# Patient Record
Sex: Female | Born: 1950 | Race: White | Hispanic: No | Marital: Married
Health system: Southern US, Community
[De-identification: ages and names within clinical notes are randomized; demographics above are authoritative.]

---

## 2020-03-10 ENCOUNTER — Inpatient Hospital Stay
Admission: RE | Admit: 2020-03-10 | Discharge: 2020-03-31 | Disposition: A | Discharge status: Skilled Nursing Facility | Payer: Medicare Other | Source: Ambulatory Visit | Attending: Internal Medicine | Admitting: Internal Medicine

## 2020-03-10 ENCOUNTER — Other Ambulatory Visit (HOSPITAL_COMMUNITY): Payer: Medicare Other

## 2020-03-10 DIAGNOSIS — J9690 Respiratory failure, unspecified, unspecified whether with hypoxia or hypercapnia: Secondary | ICD-10-CM

## 2020-03-10 DIAGNOSIS — R188 Other ascites: Secondary | ICD-10-CM

## 2020-03-10 DIAGNOSIS — J90 Pleural effusion, not elsewhere classified: Secondary | ICD-10-CM

## 2020-03-10 DIAGNOSIS — R0602 Shortness of breath: Secondary | ICD-10-CM

## 2020-03-10 LAB — CBC
HCT: 30.8 % — ABNORMAL LOW (ref 36.0–46.0)
Hemoglobin: 9.2 g/dL — ABNORMAL LOW (ref 12.0–15.0)
MCH: 27.9 pg (ref 26.0–34.0)
MCHC: 29.9 g/dL — ABNORMAL LOW (ref 30.0–36.0)
MCV: 93.3 fL (ref 80.0–100.0)
Platelets: 216 10*3/uL (ref 150–400)
RBC: 3.3 MIL/uL — ABNORMAL LOW (ref 3.87–5.11)
RDW: 17 % — ABNORMAL HIGH (ref 11.5–15.5)
WBC: 7.7 10*3/uL (ref 4.0–10.5)
nRBC: 3.5 % — ABNORMAL HIGH (ref 0.0–0.2)

## 2020-03-10 LAB — PATHOLOGIST SMEAR REVIEW

## 2020-03-10 LAB — APTT: aPTT: 24 seconds (ref 24–36)

## 2020-03-10 LAB — COMPREHENSIVE METABOLIC PANEL
ALT: 14 U/L (ref 0–44)
AST: 16 U/L (ref 15–41)
Albumin: 3.5 g/dL (ref 3.5–5.0)
Alkaline Phosphatase: 74 U/L (ref 38–126)
Anion gap: 5 (ref 5–15)
BUN: 9 mg/dL (ref 8–23)
CO2: 30 mmol/L (ref 22–32)
Calcium: 8.3 mg/dL — ABNORMAL LOW (ref 8.9–10.3)
Chloride: 99 mmol/L (ref 98–111)
Creatinine, Ser: 2.39 mg/dL — ABNORMAL HIGH (ref 0.44–1.00)
GFR, Estimated: 22 mL/min — ABNORMAL LOW (ref >60)
Glucose, Bld: 88 mg/dL (ref 70–99)
Potassium: 3.7 mmol/L (ref 3.5–5.1)
Sodium: 134 mmol/L — ABNORMAL LOW (ref 135–145)
Total Bilirubin: 0.6 mg/dL (ref 0.3–1.2)
Total Protein: 5.4 g/dL — ABNORMAL LOW (ref 6.5–8.1)

## 2020-03-10 LAB — PROTIME-INR
INR: 1.1 (ref 0.8–1.2)
Prothrombin Time: 14 seconds (ref 11.4–15.2)

## 2020-03-11 LAB — RENAL FUNCTION PANEL
Albumin: 3.2 g/dL — ABNORMAL LOW (ref 3.5–5.0)
Anion gap: 10 (ref 5–15)
BUN: 20 mg/dL (ref 8–23)
CO2: 29 mmol/L (ref 22–32)
Calcium: 9.1 mg/dL (ref 8.9–10.3)
Chloride: 99 mmol/L (ref 98–111)
Creatinine, Ser: 3.58 mg/dL — ABNORMAL HIGH (ref 0.44–1.00)
GFR, Estimated: 13 mL/min — ABNORMAL LOW (ref >60)
Glucose, Bld: 96 mg/dL (ref 70–99)
Phosphorus: 4 mg/dL (ref 2.5–4.6)
Potassium: 3.9 mmol/L (ref 3.5–5.1)
Sodium: 138 mmol/L (ref 135–145)

## 2020-03-11 LAB — HEPATITIS B SURFACE ANTIGEN: Hepatitis B Surface Ag: NONREACTIVE

## 2020-03-11 LAB — CBC
HCT: 28.3 % — ABNORMAL LOW (ref 36.0–46.0)
Hemoglobin: 8.8 g/dL — ABNORMAL LOW (ref 12.0–15.0)
MCH: 28.9 pg (ref 26.0–34.0)
MCHC: 31.1 g/dL (ref 30.0–36.0)
MCV: 93.1 fL (ref 80.0–100.0)
Platelets: 239 10*3/uL (ref 150–400)
RBC: 3.04 MIL/uL — ABNORMAL LOW (ref 3.87–5.11)
RDW: 17.2 % — ABNORMAL HIGH (ref 11.5–15.5)
WBC: 6.5 10*3/uL (ref 4.0–10.5)
nRBC: 3.2 % — ABNORMAL HIGH (ref 0.0–0.2)

## 2020-03-11 LAB — HEPATITIS B CORE ANTIBODY, TOTAL: Hep B Core Total Ab: NONREACTIVE

## 2020-03-11 LAB — HEPATITIS B SURFACE ANTIBODY,QUALITATIVE: Hep B S Ab: NONREACTIVE

## 2020-03-11 NOTE — Consult Note (Signed)
[facility] CONSULT NOTE    Date: 03/11/2020                  Patient Name:  Jillian Paul  MRN: [id number]  DOB: 04-16-1950  Age / Sex: 69 y.o., female         PCP: No primary care provider on file.                 Service Requesting Consult:  Hospitalist                 Reason for Consult:  Acute kidney injury            History of Present Illness: Patient is a 69 y.o. 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  She presented to [facility] due to anemia from GI blood loss.  She was quite hypotensive upon admission there on 02/28/2020.  Due to hypotension patient developed acute kidney injury on chronic kidney disease stage IIIb baseline EGFR 41.  She recently received albumin, octreotide, midodrine for hepatorenal syndrome.  However then she was started on hemodialysis on 02/29/2020.  Patient also had acute on chronic respiratory failure.  Ended up having thoracentesis.  In addition patient ended up having multiple paracentesis for liver cirrhosis.  She has a right internal jugular PermCath now in place.   Medications:  Current medications: Humalog sliding scale insulin, aspirin 81 mg daily, Calcitrol 0.25 mcg daily, ferrous sulfate 325 mg twice daily, levothyroxine 75 mcg daily, midodrine 15 mg 3 times daily, montelukast 10 mg daily, amiodarone 400 mg twice daily, renal vitamin 1 tablet daily, pantoprazole 40 mg twice daily, vitamin B12 1000 mcg daily, vitamin C 500 mg daily, nystatin 5 cc every 6 hours   Allergies: Atorvastatin, codeine, ibuprofen,  tramadol   Past Medical History: paroxysmal atrial fibrillation, aortic valve stenosis, heart failure with preserved EF, history of Hodgkin's lymphoma, COPD, coronary disease status post CABG and TAVR, hypertension, hyperlipidemia, hypothyroidism, liver cirrhosis with ascites, chronic kidney disease stage IIIb baseline EGFR 41, chronic respiratory failure, anemia of chronic kidney disease   Past Surgical History: Coronary disease status post CABG and TAVR Permacath placement History of multiple thoracentesis and paracentesis Cholecystectomy Bilateral hand surgery for trigger finger Hysterectomy Left inferior parathyroidectomy   Family History: Mother with history of coronary artery disease as well as diabetes mellitus.  Patient's father had history of mesothelioma.  Social History: Patient is married.  Former tobacco user.  Quit cigarettes in 1972.  No current alcohol or illicits.  Review of Systems: As per HPI  Vital Signs: Temperature 96.8 pulse 86 respirations 27 blood pressure 95/64 Weight trends: There were no vitals filed for this visit.  Physical Exam: Physical Exam: General:  No acute distress  Head:  Normocephalic, atraumatic. Moist oral mucosal membranes  Eyes:  Anicteric  Neck:  Supple  Lungs:   Scattered rhonchi, normal effort  Heart:  S1S2 irregular  Abdomen:   Soft, nontender, distention noted  Extremities:  3+ peripheral edema.  Neurologic:  Awake, alert, following commands  Skin:  Bilateral upper and lower extremity ecchymoses  Access:  Right IJ PermCath    Lab results:  Basic Metabolic Panel: Recent Labs  Lab 03/10/20 0544  NA 134*  K 3.7  CL 99  CO2 30  GLUCOSE 88  BUN 9  CREATININE 2.39*  CALCIUM 8.3*    Liver Function Tests: Recent Labs  Lab 03/10/20 0544  AST 16  ALT 14  ALKPHOS 74  BILITOT 0.6  PROT 5.4*  ALBUMIN 3.5   No results for input(s): LIPASE, AMYLASE in the last 168 hours. No results for input(s): AMMONIA in the  last 168 hours.  CBC: Recent Labs  Lab 03/10/20 0544  WBC 7.7  HGB 9.2*  HCT 30.8*  MCV 93.3  PLT 216    Cardiac Enzymes: No results for input(s): CKTOTAL, CKMB, CKMBINDEX, TROPONINI in the last 168 hours.  BNP: Invalid input(s): POCBNP  CBG: No results for input(s): GLUCAP in the last 168 hours.  Microbiology: No results found for this or any previous visit.  Coagulation Studies: Recent Labs    03/10/20 0544  LABPROT 14.0  INR 1.1    Urinalysis: No results for input(s): COLORURINE, LABSPEC, PHURINE, GLUCOSEU, HGBUR, BILIRUBINUR, KETONESUR, PROTEINUR, UROBILINOGEN, NITRITE, LEUKOCYTESUR in the last 72 hours.  Invalid input(s): APPERANCEUR    Imaging: DG Chest 1 View  Result Date: 03/10/2020 CLINICAL DATA:  Respiratory failure EXAM: CHEST  1 VIEW COMPARISON:  03/08/2020 FINDINGS: Unchanged airspace disease with air bronchograms at the bases. Continued pleural effusions with loculation on the right by recent CT, which explains the asymmetric hazy right apical density. Normal heart size. Transcatheter aortic valve replacement. Right-sided central lines in similar position. IMPRESSION: Unchanged airspace disease and pleural effusions with loculated pleural fluid on the right. Electronically Signed   By: Monte Fantasia M.D.   On: 03/10/2020 06:40      Assessment & Plan: Pt is a 69 y.o. 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  1.  Acute kidney injury/chronic kidney disease stage IIIb baseline EGFR 41.  The patient recently developed acute kidney injury requiring dialysis.   Right IJ PermCath in place.  We will plan for hemodialysis treatment today.  Ultrafiltration target 2 to 2.5 kg with use of albumin to treat underlying volume overload as well.  2.  Generalized edema/volume overload.  We will plan for use of albumin for blood pressure support during dialysis treatment given underlying cirrhosis.  Ultrafiltration target 2 to 2.5 kg as above.  3.  Anemia chronic kidney disease.  Hemoglobin 9.2.  Start the patient on Retacrit 6000 units IV with each dialysis treatment.

## 2020-03-13 LAB — CBC
HCT: 30.3 % — ABNORMAL LOW (ref 36.0–46.0)
Hemoglobin: 9 g/dL — ABNORMAL LOW (ref 12.0–15.0)
MCH: 27.9 pg (ref 26.0–34.0)
MCHC: 29.7 g/dL — ABNORMAL LOW (ref 30.0–36.0)
MCV: 93.8 fL (ref 80.0–100.0)
Platelets: 256 10*3/uL (ref 150–400)
RBC: 3.23 MIL/uL — ABNORMAL LOW (ref 3.87–5.11)
RDW: 17.5 % — ABNORMAL HIGH (ref 11.5–15.5)
WBC: 7 10*3/uL (ref 4.0–10.5)
nRBC: 2.8 % — ABNORMAL HIGH (ref 0.0–0.2)

## 2020-03-13 LAB — RENAL FUNCTION PANEL
Albumin: 3.4 g/dL — ABNORMAL LOW (ref 3.5–5.0)
Anion gap: 13 (ref 5–15)
BUN: 16 mg/dL (ref 8–23)
CO2: 29 mmol/L (ref 22–32)
Calcium: 8.7 mg/dL — ABNORMAL LOW (ref 8.9–10.3)
Chloride: 98 mmol/L (ref 98–111)
Creatinine, Ser: 3.68 mg/dL — ABNORMAL HIGH (ref 0.44–1.00)
GFR, Estimated: 13 mL/min — ABNORMAL LOW (ref >60)
Glucose, Bld: 83 mg/dL (ref 70–99)
Phosphorus: 4 mg/dL (ref 2.5–4.6)
Potassium: 4.1 mmol/L (ref 3.5–5.1)
Sodium: 140 mmol/L (ref 135–145)

## 2020-03-13 NOTE — Progress Notes (Signed)
  [facility]  ROUNDING NOTE   Subjective:  Patient reports making very little urine at the moment. Currently sitting up in a chair. Due for dialysis treatment again today.   Objective:  Vital signs in last 24 hours:  Temperature 96 pulse 86 respirations 22 blood pressure 121/49   Physical Exam: General:  No acute distress  Head:  Normocephalic, atraumatic. Moist oral mucosal membranes  Eyes:  Anicteric  Neck:  Supple  Lungs:   Clear to auscultation, normal effort  Heart:  S1S2 no rubs  Abdomen:   Soft, nontender, bowel sounds present  Extremities:  2+ peripheral edema.  Neurologic:  Awake, alert, following commands  Skin:  No lesions  Access:  Right IJ PermCath    Basic Metabolic Panel: Recent Labs  Lab 03/10/20 0544 03/11/20 1449 03/13/20 0555  NA 134* 138 140  K 3.7 3.9 4.1  CL 99 99 98  CO2 _0 GLUCOSE 88 96 83  BUN _1 CREATININE 2.39* 3.58* 3.68*  CALCIUM 8.3* 9.1 8.7*  PHOS  --  4.0 4.0    Liver Function Tests: Recent Labs  Lab 03/10/20 0544 03/11/20 1449 03/13/20 0555  AST 16  --   --   ALT 14  --   --   ALKPHOS 74  --   --   BILITOT 0.6  --   --   PROT 5.4*  --   --   ALBUMIN 3.5 3.2* 3.4*   No results for input(s): LIPASE, AMYLASE in the last 168 hours. No results for input(s): AMMONIA in the last 168 hours.  CBC: Recent Labs  Lab 03/10/20 0544 03/11/20 1449 03/13/20 0555  WBC 7.7 6.5 7.0  HGB 9.2* 8.8* 9.0*  HCT 30.8* 28.3* 30.3*  MCV 93.3 93.1 93.8  PLT 216 239 256    Cardiac Enzymes: No results for input(s): CKTOTAL, CKMB, CKMBINDEX, TROPONINI in the last 168 hours.  BNP: Invalid input(s): POCBNP  CBG: No results for input(s): GLUCAP in the last 168 hours.  Microbiology: No results found for this or any previous visit.  Coagulation Studies: No results for input(s): LABPROT, INR in the last 72 hours.  Urinalysis: No results for input(s): COLORURINE, LABSPEC, PHURINE, GLUCOSEU, HGBUR,  BILIRUBINUR, KETONESUR, PROTEINUR, UROBILINOGEN, NITRITE, LEUKOCYTESUR in the last 72 hours.  Invalid input(s): APPERANCEUR    Imaging: No results found.   Medications:       Assessment/ Plan:  69 y.o. 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  1.  Acute kidney injury/chronic kidney disease stage IIIb baseline EGFR 41.    Patient continues to have significant acute kidney injury.  Creatinine currently 3.7-patient reports making very little urine.  Therefore we will continue dialysis treatment for now.  2.  Generalized edema/volume overload.  Albumin available for blood pressure support during dialysis treatment.  Ultrafiltration target 2 to 2.5 kg.  3.  Anemia of chronic kidney disease.  Start the patient on Retacrit 6000 units IV with dialysis treatments.   LOS: 0 Prayan Ulin 12/3/20217:52 AM

## 2020-03-14 LAB — AMMONIA: Ammonia: 66 umol/L — ABNORMAL HIGH (ref 9–35)

## 2020-03-15 ENCOUNTER — Other Ambulatory Visit (HOSPITAL_COMMUNITY): Payer: Medicare Other

## 2020-03-15 LAB — BLOOD GAS, ARTERIAL
Acid-Base Excess: 0.6 mmol/L (ref 0.0–2.0)
Acid-Base Excess: 1.2 mmol/L (ref 0.0–2.0)
Bicarbonate: 28.1 mmol/L — ABNORMAL HIGH (ref 20.0–28.0)
Bicarbonate: 28 mmol/L (ref 20.0–28.0)
FIO2: 100
FIO2: 45
O2 Saturation: 98.9 %
O2 Saturation: 95.5 %
Patient temperature: 36.7
Patient temperature: 36.1
pCO2 arterial: 75.5 mmHg (ref 32.0–48.0)
pCO2 arterial: 66.7 mmHg (ref 32.0–48.0)
pH, Arterial: 7.193 — CL (ref 7.350–7.450)
pH, Arterial: 7.241 — ABNORMAL LOW (ref 7.350–7.450)
pO2, Arterial: 132 mmHg — ABNORMAL HIGH (ref 83.0–108.0)
pO2, Arterial: 76.4 mmHg — ABNORMAL LOW (ref 83.0–108.0)

## 2020-03-16 ENCOUNTER — Other Ambulatory Visit (HOSPITAL_COMMUNITY): Payer: Medicare Other

## 2020-03-16 LAB — RENAL FUNCTION PANEL
Albumin: 3 g/dL — ABNORMAL LOW (ref 3.5–5.0)
Anion gap: 13 (ref 5–15)
BUN: 23 mg/dL (ref 8–23)
CO2: 26 mmol/L (ref 22–32)
Calcium: 8.8 mg/dL — ABNORMAL LOW (ref 8.9–10.3)
Chloride: 98 mmol/L (ref 98–111)
Creatinine, Ser: 4.41 mg/dL — ABNORMAL HIGH (ref 0.44–1.00)
GFR, Estimated: 10 mL/min — ABNORMAL LOW (ref >60)
Glucose, Bld: 70 mg/dL (ref 70–99)
Phosphorus: 3.6 mg/dL (ref 2.5–4.6)
Potassium: 4 mmol/L (ref 3.5–5.1)
Sodium: 137 mmol/L (ref 135–145)

## 2020-03-16 LAB — COMPREHENSIVE METABOLIC PANEL
ALT: 13 U/L (ref 0–44)
AST: 17 U/L (ref 15–41)
Albumin: 3 g/dL — ABNORMAL LOW (ref 3.5–5.0)
Alkaline Phosphatase: 88 U/L (ref 38–126)
Anion gap: 14 (ref 5–15)
BUN: 22 mg/dL (ref 8–23)
CO2: 26 mmol/L (ref 22–32)
Calcium: 8.8 mg/dL — ABNORMAL LOW (ref 8.9–10.3)
Chloride: 98 mmol/L (ref 98–111)
Creatinine, Ser: 4.43 mg/dL — ABNORMAL HIGH (ref 0.44–1.00)
GFR, Estimated: 10 mL/min — ABNORMAL LOW (ref >60)
Glucose, Bld: 72 mg/dL (ref 70–99)
Potassium: 4 mmol/L (ref 3.5–5.1)
Sodium: 138 mmol/L (ref 135–145)
Total Bilirubin: 0.7 mg/dL (ref 0.3–1.2)
Total Protein: 5 g/dL — ABNORMAL LOW (ref 6.5–8.1)

## 2020-03-16 LAB — AMMONIA: Ammonia: 35 umol/L (ref 9–35)

## 2020-03-16 LAB — CBC
HCT: 26.9 % — ABNORMAL LOW (ref 36.0–46.0)
Hemoglobin: 8.8 g/dL — ABNORMAL LOW (ref 12.0–15.0)
MCH: 29.5 pg (ref 26.0–34.0)
MCHC: 32.7 g/dL (ref 30.0–36.0)
MCV: 90.3 fL (ref 80.0–100.0)
Platelets: 275 10*3/uL (ref 150–400)
RBC: 2.98 MIL/uL — ABNORMAL LOW (ref 3.87–5.11)
RDW: 17.5 % — ABNORMAL HIGH (ref 11.5–15.5)
WBC: 6.7 10*3/uL (ref 4.0–10.5)
nRBC: 4.8 % — ABNORMAL HIGH (ref 0.0–0.2)

## 2020-03-16 NOTE — Progress Notes (Signed)
[facility]  ROUNDING NOTE   Subjective:  Patient seen and evaluated at bedside. Currently on CPAP. Due for dialysis treatment today. Currently resting comfortably.   Objective:  Vital signs in last 24 hours:  Temperature 96.6 pulse 74 respirations 39 blood pressure 106/26   Physical Exam: General:  No acute distress  Head:  Normocephalic, atraumatic. Moist oral mucosal membranes  Eyes:  Anicteric  Neck:  Supple  Lungs:   Clear to auscultation, normal effort, currently on CPAP  Heart:  S1S2 no rubs  Abdomen:   Soft, nontender, bowel sounds present  Extremities:  1+ peripheral edema.  Neurologic:  Awake, alert, following commands  Skin:  No lesions  Access:  Right IJ PermCath    Basic Metabolic Panel: Recent Labs  Lab 03/10/20 0544 03/10/20 0544 03/11/20 1449 03/13/20 0555 03/16/20 0607  NA 134*  --  138 140 138  137  K 3.7  --  3.9 4.1 4.0  4.0  CL 99  --  99 98 98  98  CO2 30  --  $R'29 29 26  26  'RZ$ GLUCOSE 88  --  96 83 72  70  BUN 9  --  $R'20 16 22  23  'ZC$ CREATININE 2.39*  --  3.58* 3.68* 4.43*  4.41*  CALCIUM 8.3*   < > 9.1 8.7* 8.8*  8.8*  PHOS  --   --  4.0 4.0 3.6   < > = values in this interval not displayed.    Liver Function Tests: Recent Labs  Lab 03/10/20 0544 03/11/20 1449 03/13/20 0555 03/16/20 0607  AST 16  --   --  17  ALT 14  --   --  13  ALKPHOS 74  --   --  88  BILITOT 0.6  --   --  0.7  PROT 5.4*  --   --  5.0*  ALBUMIN 3.5 3.2* 3.4* 3.0*  3.0*   No results for input(s): LIPASE, AMYLASE in the last 168 hours. Recent Labs  Lab 03/14/20 1452 03/16/20 0607  AMMONIA 66* 35    CBC: Recent Labs  Lab 03/10/20 0544 03/11/20 1449 03/13/20 0555 03/16/20 0607  WBC 7.7 6.5 7.0 PENDING  HGB 9.2* 8.8* 9.0* 8.8*  HCT 30.8* 28.3* 30.3* 26.9*  MCV 93.3 93.1 93.8 90.3  PLT 216 239 256 275    Cardiac Enzymes: No results for input(s): CKTOTAL, CKMB, CKMBINDEX, TROPONINI in the last 168 hours.  BNP: Invalid  input(s): POCBNP  CBG: No results for input(s): GLUCAP in the last 168 hours.  Microbiology: No results found for this or any previous visit.  Coagulation Studies: No results for input(s): LABPROT, INR in the last 72 hours.  Urinalysis: No results for input(s): COLORURINE, LABSPEC, PHURINE, GLUCOSEU, HGBUR, BILIRUBINUR, KETONESUR, PROTEINUR, UROBILINOGEN, NITRITE, LEUKOCYTESUR in the last 72 hours.  Invalid input(s): APPERANCEUR    Imaging: DG CHEST PORT 1 VIEW  Result Date: 03/15/2020 CLINICAL DATA:  Shortness of breath EXAM: PORTABLE CHEST 1 VIEW COMPARISON:  Chest x-rays dated 03/10/2020 03/08/2020 FINDINGS: Stable bilateral airspace opacities. Stable bilateral pleural effusions, at least moderate in size. No pneumothorax is seen. Heart size and mediastinal contours appear stable. RIGHT-sided central catheter is stable in position with tip overlying the RIGHT atrium. IMPRESSION: 1. Stable bilateral airspace opacities, multifocal pneumonia versus pulmonary edema. 2. Bilateral pleural effusions, at least moderate in size. The RIGHT pleural effusion which shown to be loculated on earlier chest CT of 02/28/2020. Electronically Signed   By: Cherlynn Kaiser  Enriqueta Shutter M.D.   On: 03/15/2020 10:58     Medications:       Assessment/ Plan:  69 y.o. 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  1.  Acute kidney injury/chronic kidney disease stage IIIb baseline EGFR 41.    Patient seen and evaluated bedside.  Due for dialysis treatment today.  2.  Generalized edema/volume overload.  Albumin currently 3.0.   Improving nutritional status will help to improve volume overload.  Continue ultrafiltration with dialysis.  3.  Anemia of chronic kidney disease.  Continue Retacrit 6000 IV with dialysis.  Hemoglobin currently 8.8.   LOS: 0 Jillian Paul 12/6/20218:16 AM

## 2020-03-17 ENCOUNTER — Other Ambulatory Visit (HOSPITAL_COMMUNITY): Payer: Medicare Other

## 2020-03-17 HISTORY — PX: IR PARACENTESIS: IMG2679

## 2020-03-17 MED ORDER — LIDOCAINE HCL 1 % IJ SOLN
INTRAMUSCULAR | Status: DC | PRN
  Administered 2020-03-17: 10 mL

## 2020-03-17 MED ORDER — LIDOCAINE HCL 1 % IJ SOLN
INTRAMUSCULAR | Status: AC
  Filled 2020-03-17: qty 20

## 2020-03-17 NOTE — Procedures (Signed)
PROCEDURE SUMMARY:  Successful image-guided paracentesis from the right lateral abdomen.  Yielded 4.1 liters of clear gold fluid.  No immediate complications.  EBL < 1 mL. Patient tolerated well.   Specimen was not sent for labs.  Please see imaging section of Epic for full dictation.   Gordy Councilman Jacy Brocker PA-C 03/17/2020 10:25 AM

## 2020-03-18 LAB — CBC
HCT: 31.7 % — ABNORMAL LOW (ref 36.0–46.0)
Hemoglobin: 9.7 g/dL — ABNORMAL LOW (ref 12.0–15.0)
MCH: 28 pg (ref 26.0–34.0)
MCHC: 30.6 g/dL (ref 30.0–36.0)
MCV: 91.6 fL (ref 80.0–100.0)
Platelets: 296 10*3/uL (ref 150–400)
RBC: 3.46 MIL/uL — ABNORMAL LOW (ref 3.87–5.11)
RDW: 18.3 % — ABNORMAL HIGH (ref 11.5–15.5)
WBC: 7 10*3/uL (ref 4.0–10.5)
nRBC: 4.8 % — ABNORMAL HIGH (ref 0.0–0.2)

## 2020-03-18 LAB — RENAL FUNCTION PANEL
Albumin: 3.1 g/dL — ABNORMAL LOW (ref 3.5–5.0)
Anion gap: 11 (ref 5–15)
BUN: 27 mg/dL — ABNORMAL HIGH (ref 8–23)
CO2: 28 mmol/L (ref 22–32)
Calcium: 8.9 mg/dL (ref 8.9–10.3)
Chloride: 102 mmol/L (ref 98–111)
Creatinine, Ser: 3.58 mg/dL — ABNORMAL HIGH (ref 0.44–1.00)
GFR, Estimated: 13 mL/min — ABNORMAL LOW (ref >60)
Glucose, Bld: 99 mg/dL (ref 70–99)
Phosphorus: 3.4 mg/dL (ref 2.5–4.6)
Potassium: 3.7 mmol/L (ref 3.5–5.1)
Sodium: 141 mmol/L (ref 135–145)

## 2020-03-18 NOTE — Progress Notes (Signed)
[facility]  ROUNDING NOTE   Subjective:  Patient seen and evaluated during hemodialysis treatment. Tolerating well.   Objective:  Vital signs in last 24 hours:  Temperature 97.6 pulse 86 respirations 14 blood pressure 94/44   Physical Exam: General:  No acute distress  Head:  Normocephalic, atraumatic. Moist oral mucosal membranes  Eyes:  Anicteric  Neck:  Supple  Lungs:   Clear to auscultation, normal effort, currently on CPAP  Heart:  S1S2 no rubs  Abdomen:   Soft, nontender, bowel sounds present  Extremities:  1+ peripheral edema.  Neurologic:  Awake, alert, following commands  Skin:  No lesions  Access:  Right IJ PermCath    Basic Metabolic Panel: Recent Labs  Lab 03/11/20 1449 03/11/20 1449 03/13/20 0555 03/16/20 0607 03/18/20 0649  NA 138  --  140 138  137 141  K 3.9  --  4.1 4.0  4.0 3.7  CL 99  --  98 98  98 102  CO2 29  --  _0 GLUCOSE 96  --  83 72  70 99  BUN 20  --  _1 27*  CREATININE 3.58*  --  3.68* 4.43*  4.41* 3.58*  CALCIUM 9.1   < > 8.7* 8.8*  8.8* 8.9  PHOS 4.0  --  4.0 3.6 3.4   < > = values in this interval not displayed.    Liver Function Tests: Recent Labs  Lab 03/11/20 1449 03/13/20 0555 03/16/20 0607 03/18/20 0649  AST  --   --  17  --   ALT  --   --  13  --   ALKPHOS  --   --  88  --   BILITOT  --   --  0.7  --   PROT  --   --  5.0*  --   ALBUMIN 3.2* 3.4* 3.0*  3.0* 3.1*   No results for input(s): LIPASE, AMYLASE in the last 168 hours. Recent Labs  Lab 03/14/20 1452 03/16/20 0607  AMMONIA 66* 35    CBC: Recent Labs  Lab 03/11/20 1449 03/13/20 0555 03/16/20 0607 03/18/20 0649  WBC 6.5 7.0 6.7 7.0  HGB 8.8* 9.0* 8.8* 9.7*  HCT 28.3* 30.3* 26.9* 31.7*  MCV 93.1 93.8 90.3 91.6  PLT 239 256 275 296    Cardiac Enzymes: No results for input(s): CKTOTAL, CKMB, CKMBINDEX, TROPONINI in the last 168 hours.  BNP: Invalid input(s): POCBNP  CBG: No results for input(s):  GLUCAP in the last 168 hours.  Microbiology: No results found for this or any previous visit.  Coagulation Studies: No results for input(s): LABPROT, INR in the last 72 hours.  Urinalysis: No results for input(s): COLORURINE, LABSPEC, PHURINE, GLUCOSEU, HGBUR, BILIRUBINUR, KETONESUR, PROTEINUR, UROBILINOGEN, NITRITE, LEUKOCYTESUR in the last 72 hours.  Invalid input(s): APPERANCEUR    Imaging: IR Paracentesis  Result Date: 03/17/2020 INDICATION: Patient with history of HF, Hodgkin's lymphoma, CKD stage 3, liver cirrhosis, abdominal distension, and recurrent ascites. Request is made for therapeutic paracentesis. EXAM: ULTRASOUND GUIDED THERAPEUTIC PARACENTESIS MEDICATIONS: 10 mL 1% lidocaine COMPLICATIONS: None immediate. PROCEDURE: Informed written consent was obtained from the patient after a discussion of the risks, benefits and alternatives to treatment. A timeout was performed prior to the initiation of the procedure. Initial ultrasound scanning demonstrates a large amount of ascites within the right lower abdominal quadrant. The right lower abdomen was prepped and draped in the usual sterile fashion. 1% lidocaine was used for  local anesthesia. Following this, a 19 gauge, 7-cm, Yueh catheter was introduced. An ultrasound image was saved for documentation purposes. The paracentesis was performed. The catheter was removed and a dressing was applied. The patient tolerated the procedure well without immediate post procedural complication. FINDINGS: A total of approximately 4.1 L of clear gold fluid was removed. IMPRESSION: Successful ultrasound-guided paracentesis yielding 4.1 L of peritoneal fluid. Read by: Earley Abide, PA-C Electronically Signed   By: Ruthann Cancer MD   On: 03/17/2020 10:27     Medications:       Assessment/ Plan:  69 y.o. female with a PMHx of paroxysmal atrial fibrillation, aortic valve stenosis, heart failure with preserved EF, history of Hodgkin's lymphoma, COPD,  coronary disease status post CABG and TAVR, hypertension, hyperlipidemia, hypothyroidism, liver cirrhosis with ascites, chronic kidney disease stage IIIb baseline EGFR 41, chronic respiratory failure, anemia of chronic kidney disease, who was admitted to [facility] on 03/10/2020 for ongoing care.  1.  Acute kidney injury/chronic kidney disease stage IIIb baseline EGFR 41.    Patient remains dialysis dependent.  Seen and evaluated during dialysis treatment.  Ultrafiltration target reduced to 1.5 kg.  2.  Generalized edema/volume overload.  Continue ultrafiltration with dialysis treatments.  We will also continue the patient on albumin for blood pressure support during dialysis treatments.  3.  Anemia of chronic kidney disease.  Hemoglobin up to 9.7.  Maintain the patient on Retacrit 6000 IV with dialysis.  4.  Hypotension.  Continue to use albumin during dialysis treatments as well as midodrine.   LOS: 0 Brynlyn Dade 12/8/20218:56 AM

## 2020-03-19 ENCOUNTER — Other Ambulatory Visit (HOSPITAL_COMMUNITY): Payer: Medicare Other

## 2020-03-20 LAB — CBC
HCT: 30.3 % — ABNORMAL LOW (ref 36.0–46.0)
Hemoglobin: 9.4 g/dL — ABNORMAL LOW (ref 12.0–15.0)
MCH: 28.2 pg (ref 26.0–34.0)
MCHC: 31 g/dL (ref 30.0–36.0)
MCV: 91 fL (ref 80.0–100.0)
Platelets: 246 10*3/uL (ref 150–400)
RBC: 3.33 MIL/uL — ABNORMAL LOW (ref 3.87–5.11)
RDW: 18.2 % — ABNORMAL HIGH (ref 11.5–15.5)
WBC: 7 10*3/uL (ref 4.0–10.5)
nRBC: 3.4 % — ABNORMAL HIGH (ref 0.0–0.2)

## 2020-03-20 LAB — RENAL FUNCTION PANEL
Albumin: 2.8 g/dL — ABNORMAL LOW (ref 3.5–5.0)
Anion gap: 13 (ref 5–15)
BUN: 36 mg/dL — ABNORMAL HIGH (ref 8–23)
CO2: 24 mmol/L (ref 22–32)
Calcium: 8.6 mg/dL — ABNORMAL LOW (ref 8.9–10.3)
Chloride: 101 mmol/L (ref 98–111)
Creatinine, Ser: 3.74 mg/dL — ABNORMAL HIGH (ref 0.44–1.00)
GFR, Estimated: 13 mL/min — ABNORMAL LOW (ref >60)
Glucose, Bld: 83 mg/dL (ref 70–99)
Phosphorus: 4.7 mg/dL — ABNORMAL HIGH (ref 2.5–4.6)
Potassium: 3.9 mmol/L (ref 3.5–5.1)
Sodium: 138 mmol/L (ref 135–145)

## 2020-03-20 NOTE — Progress Notes (Signed)
[facility]  ROUNDING NOTE   Subjective:  Patient seen and evaluated this AM. Currently sitting up in a chair. Creatinine currently 3.7.  Objective:  Vital signs in last 24 hours:  Temperature 97.1 pulse 87 respirations 30 blood pressure 106/46   Physical Exam: General:  No acute distress  Head:  Normocephalic, atraumatic. Moist oral mucosal membranes  Eyes:  Anicteric  Neck:  Supple  Lungs:   Clear to auscultation, normal effort, currently on CPAP  Heart:  S1S2 no rubs  Abdomen:   Soft, nontender, bowel sounds present  Extremities:  1+ peripheral edema.  Neurologic:  Awake, alert, following commands  Skin:  No lesions  Access:  Right IJ PermCath    Basic Metabolic Panel: Recent Labs  Lab 03/16/20 0607 03/18/20 0649 03/20/20 0441  NA 138  137 141 138  K 4.0  4.0 3.7 3.9  CL 98  98 102 101  CO2 $Re'26  26 28 24  'nRn$ GLUCOSE 72  70 99 83  BUN 22  23 27* 36*  CREATININE 4.43*  4.41* 3.58* 3.74*  CALCIUM 8.8*  8.8* 8.9 8.6*  PHOS 3.6 3.4 4.7*    Liver Function Tests: Recent Labs  Lab 03/16/20 0607 03/18/20 0649 03/20/20 0441  AST 17  --   --   ALT 13  --   --   ALKPHOS 88  --   --   BILITOT 0.7  --   --   PROT 5.0*  --   --   ALBUMIN 3.0*  3.0* 3.1* 2.8*   No results for input(s): LIPASE, AMYLASE in the last 168 hours. Recent Labs  Lab 03/14/20 1452 03/16/20 0607  AMMONIA 66* 35    CBC: Recent Labs  Lab 03/16/20 0607 03/18/20 0649 03/20/20 0441  WBC 6.7 7.0 7.0  HGB 8.8* 9.7* 9.4*  HCT 26.9* 31.7* 30.3*  MCV 90.3 91.6 91.0  PLT 275 296 246    Cardiac Enzymes: No results for input(s): CKTOTAL, CKMB, CKMBINDEX, TROPONINI in the last 168 hours.  BNP: Invalid input(s): POCBNP  CBG: No results for input(s): GLUCAP in the last 168 hours.  Microbiology: No results found for this or any previous visit.  Coagulation Studies: No results for input(s): LABPROT, INR in the last 72 hours.  Urinalysis: No results for input(s):  COLORURINE, LABSPEC, PHURINE, GLUCOSEU, HGBUR, BILIRUBINUR, KETONESUR, PROTEINUR, UROBILINOGEN, NITRITE, LEUKOCYTESUR in the last 72 hours.  Invalid input(s): APPERANCEUR    Imaging: DG CHEST PORT 1 VIEW  Result Date: 03/19/2020 CLINICAL DATA:  Pleural effusion EXAM: PORTABLE CHEST 1 VIEW COMPARISON:  03/15/2020 FINDINGS: Right multi lumen central line is unchanged. Persistent bilateral pleural effusions with adjacent atelectasis. Slightly improved aeration at the right lung base. Similar persistent patchy density bilaterally. No pneumothorax. Stable partially obscured cardiomediastinal contours. Post TAVR. IMPRESSION: Persistent bilateral pleural effusions with adjacent atelectasis. Slightly improved aeration at the right lung base. Similar bilateral patchy opacities. Electronically Signed   By: Macy Mis M.D.   On: 03/19/2020 11:57     Medications:       Assessment/ Plan:  68 y.o. 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  1.  Acute kidney injury/chronic kidney disease stage  IIIb baseline EGFR 41.    Patient due for hemodialysis treatment today.  Overall volume status improved.  2.  Generalized edema/volume overload.  Volume status improved with dialysis sessions.  3.  Anemia of chronic kidney disease.  Hemoglobin currently 9.4.  Continue Retacrit 6000 units IV with dialysis.  4.  Hypotension.  Patient has albumin and midodrine available for blood pressure support.   LOS: 0 Pryor Guettler 12/10/20218:38 AM

## 2020-03-23 LAB — RENAL FUNCTION PANEL
Albumin: 3.2 g/dL — ABNORMAL LOW (ref 3.5–5.0)
Anion gap: 14 (ref 5–15)
BUN: 41 mg/dL — ABNORMAL HIGH (ref 8–23)
CO2: 26 mmol/L (ref 22–32)
Calcium: 9.4 mg/dL (ref 8.9–10.3)
Chloride: 99 mmol/L (ref 98–111)
Creatinine, Ser: 4.81 mg/dL — ABNORMAL HIGH (ref 0.44–1.00)
GFR, Estimated: 9 mL/min — ABNORMAL LOW (ref >60)
Glucose, Bld: 87 mg/dL (ref 70–99)
Phosphorus: 5.5 mg/dL — ABNORMAL HIGH (ref 2.5–4.6)
Potassium: 4.6 mmol/L (ref 3.5–5.1)
Sodium: 139 mmol/L (ref 135–145)

## 2020-03-23 LAB — CBC
HCT: 31 % — ABNORMAL LOW (ref 36.0–46.0)
Hemoglobin: 9.9 g/dL — ABNORMAL LOW (ref 12.0–15.0)
MCH: 28.4 pg (ref 26.0–34.0)
MCHC: 31.9 g/dL (ref 30.0–36.0)
MCV: 89.1 fL (ref 80.0–100.0)
Platelets: 349 10*3/uL (ref 150–400)
RBC: 3.48 MIL/uL — ABNORMAL LOW (ref 3.87–5.11)
RDW: 18.2 % — ABNORMAL HIGH (ref 11.5–15.5)
WBC: 8.5 10*3/uL (ref 4.0–10.5)
nRBC: 0 % (ref 0.0–0.2)

## 2020-03-23 LAB — T4, FREE: Free T4: 1.27 ng/dL — ABNORMAL HIGH (ref 0.61–1.12)

## 2020-03-23 LAB — TSH: TSH: 2.032 u[IU]/mL (ref 0.350–4.500)

## 2020-03-23 LAB — AMMONIA: Ammonia: 22 umol/L (ref 9–35)

## 2020-03-23 NOTE — Progress Notes (Signed)
  [facility]  ROUNDING NOTE   Subjective:  Patient remains dialysis dependent. Awake and alert.   Objective:  Vital signs in last 24 hours:  Temperature 97.1 pulse 87 respirations 30 blood pressure 106/46   Physical Exam: General:  No acute distress  Head:  Normocephalic, atraumatic. Moist oral mucosal membranes  Eyes:  Anicteric  Neck:  Supple  Lungs:   Clear to auscultation, normal effort  Heart:  S1S2 no rubs  Abdomen:   Soft, nontender, bowel sounds present  Extremities:  1+ peripheral edema.  Neurologic:  Awake, alert, following commands  Skin:  No lesions  Access:  Right IJ PermCath    Basic Metabolic Panel: Recent Labs  Lab 03/18/20 0649 03/20/20 0441 03/23/20 0636  NA 141 138 139  K 3.7 3.9 4.6  CL 102 101 99  CO2 _0 GLUCOSE 99 83 87  BUN 27* 36* 41*  CREATININE 3.58* 3.74* 4.81*  CALCIUM 8.9 8.6* 9.4  PHOS 3.4 4.7* 5.5*    Liver Function Tests: Recent Labs  Lab 03/18/20 0649 03/20/20 0441 03/23/20 0636  ALBUMIN 3.1* 2.8* 3.2*   No results for input(s): LIPASE, AMYLASE in the last 168 hours. No results for input(s): AMMONIA in the last 168 hours.  CBC: Recent Labs  Lab 03/18/20 0649 03/20/20 0441 03/23/20 0636  WBC 7.0 7.0 8.5  HGB 9.7* 9.4* 9.9*  HCT 31.7* 30.3* 31.0*  MCV 91.6 91.0 89.1  PLT 296 246 349    Cardiac Enzymes: No results for input(s): CKTOTAL, CKMB, CKMBINDEX, TROPONINI in the last 168 hours.  BNP: Invalid input(s): POCBNP  CBG: No results for input(s): GLUCAP in the last 168 hours.  Microbiology: No results found for this or any previous visit.  Coagulation Studies: No results for input(s): LABPROT, INR in the last 72 hours.  Urinalysis: No results for input(s): COLORURINE, LABSPEC, PHURINE, GLUCOSEU, HGBUR, BILIRUBINUR, KETONESUR, PROTEINUR, UROBILINOGEN, NITRITE, LEUKOCYTESUR in the last 72 hours.  Invalid input(s): APPERANCEUR    Imaging: No results found.   Medications:        Assessment/ Plan:  69 y.o. 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  1.  Acute kidney injury/chronic kidney disease stage IIIb baseline EGFR 41.    Patient remains dialysis dependent at the moment.  Creatinine currently 4.81.  We will plan for dialysis treatment today.  2.  Generalized edema/volume overload.  Maintain ultrafiltration with dialysis sessions.  3.  Anemia of chronic kidney disease.  Hemoglobin up to 9.9.  Continue the patient on Retacrit 6000 IV with dialysis treatments.  4.  Hypotension.  Patient has albumin and midodrine available for blood pressure support.   LOS: 0 Levaeh Vice 12/13/20218:10 AM

## 2020-03-25 LAB — CBC
HCT: 31.8 % — ABNORMAL LOW (ref 36.0–46.0)
Hemoglobin: 9.8 g/dL — ABNORMAL LOW (ref 12.0–15.0)
MCH: 27.9 pg (ref 26.0–34.0)
MCHC: 30.8 g/dL (ref 30.0–36.0)
MCV: 90.6 fL (ref 80.0–100.0)
Platelets: 352 10*3/uL (ref 150–400)
RBC: 3.51 MIL/uL — ABNORMAL LOW (ref 3.87–5.11)
RDW: 18.1 % — ABNORMAL HIGH (ref 11.5–15.5)
WBC: 8.1 10*3/uL (ref 4.0–10.5)
nRBC: 0.7 % — ABNORMAL HIGH (ref 0.0–0.2)

## 2020-03-25 LAB — RENAL FUNCTION PANEL
Albumin: 3.3 g/dL — ABNORMAL LOW (ref 3.5–5.0)
Anion gap: 11 (ref 5–15)
BUN: 40 mg/dL — ABNORMAL HIGH (ref 8–23)
CO2: 26 mmol/L (ref 22–32)
Calcium: 9.3 mg/dL (ref 8.9–10.3)
Chloride: 100 mmol/L (ref 98–111)
Creatinine, Ser: 4.78 mg/dL — ABNORMAL HIGH (ref 0.44–1.00)
GFR, Estimated: 9 mL/min — ABNORMAL LOW (ref >60)
Glucose, Bld: 91 mg/dL (ref 70–99)
Phosphorus: 5.7 mg/dL — ABNORMAL HIGH (ref 2.5–4.6)
Potassium: 4.4 mmol/L (ref 3.5–5.1)
Sodium: 137 mmol/L (ref 135–145)

## 2020-03-25 NOTE — Progress Notes (Signed)
  [facility]  ROUNDING NOTE   Subjective:  Patient seen and evaluated during hemodialysis treatment.  Sitting up in chair. In good spirits today.   Objective:  Vital signs in last 24 hours:  Temperature 97.1 pulse 96 respirations 30 blood pressure 125/55   Physical Exam: General:  No acute distress  Head:  Normocephalic, atraumatic. Moist oral mucosal membranes  Eyes:  Anicteric  Neck:  Supple  Lungs:   Clear to auscultation, normal effort  Heart:  S1S2 no rubs  Abdomen:   Soft, nontender, bowel sounds present  Extremities:  1+ peripheral edema.  Neurologic:  Awake, alert, following commands  Skin:  No lesions  Access:  Right IJ PermCath    Basic Metabolic Panel: Recent Labs  Lab 03/20/20 0441 03/23/20 0636 03/25/20 0508  NA 138 139 137  K 3.9 4.6 4.4  CL 101 99 100  CO2 $Re'24 26 26  'wJn$ GLUCOSE 83 87 91  BUN 36* 41* 40*  CREATININE 3.74* 4.81* 4.78*  CALCIUM 8.6* 9.4 9.3  PHOS 4.7* 5.5* 5.7*    Liver Function Tests: Recent Labs  Lab 03/20/20 0441 03/23/20 0636 03/25/20 0508  ALBUMIN 2.8* 3.2* 3.3*   No results for input(s): LIPASE, AMYLASE in the last 168 hours. Recent Labs  Lab 03/23/20 0636  AMMONIA 22    CBC: Recent Labs  Lab 03/20/20 0441 03/23/20 0636 03/25/20 0508  WBC 7.0 8.5 8.1  HGB 9.4* 9.9* 9.8*  HCT 30.3* 31.0* 31.8*  MCV 91.0 89.1 90.6  PLT 246 349 352    Cardiac Enzymes: No results for input(s): CKTOTAL, CKMB, CKMBINDEX, TROPONINI in the last 168 hours.  BNP: Invalid input(s): POCBNP  CBG: No results for input(s): GLUCAP in the last 168 hours.  Microbiology: No results found for this or any previous visit.  Coagulation Studies: No results for input(s): LABPROT, INR in the last 72 hours.  Urinalysis: No results for input(s): COLORURINE, LABSPEC, PHURINE, GLUCOSEU, HGBUR, BILIRUBINUR, KETONESUR, PROTEINUR, UROBILINOGEN, NITRITE, LEUKOCYTESUR in the last 72 hours.  Invalid input(s): APPERANCEUR     Imaging: No results found.   Medications:       Assessment/ Plan:  69 y.o. 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  1.  Acute kidney injury/chronic kidney disease stage IIIb baseline EGFR 41.    Patient seen and evaluated during hemodialysis treatment this AM.  Tolerating well.  We plan to complete dialysis treatment today.  2.  Generalized edema/volume overload.  Overall significantly improved over the course of the hospitalization.  3.  Anemia of chronic kidney disease.  Continue Retacrit 6000 units IV with dialysis treatments.  4.  Hypotension.  Patient has albumin and midodrine available for blood pressure support.   LOS: 0 Taygan Connell 12/15/20218:06 AM

## 2020-03-27 LAB — RENAL FUNCTION PANEL
Albumin: 3.3 g/dL — ABNORMAL LOW (ref 3.5–5.0)
Anion gap: 13 (ref 5–15)
BUN: 38 mg/dL — ABNORMAL HIGH (ref 8–23)
CO2: 26 mmol/L (ref 22–32)
Calcium: 9.7 mg/dL (ref 8.9–10.3)
Chloride: 96 mmol/L — ABNORMAL LOW (ref 98–111)
Creatinine, Ser: 4.78 mg/dL — ABNORMAL HIGH (ref 0.44–1.00)
GFR, Estimated: 9 mL/min — ABNORMAL LOW (ref >60)
Glucose, Bld: 94 mg/dL (ref 70–99)
Phosphorus: 4.5 mg/dL (ref 2.5–4.6)
Potassium: 4.1 mmol/L (ref 3.5–5.1)
Sodium: 135 mmol/L (ref 135–145)

## 2020-03-27 LAB — CBC
HCT: 31.9 % — ABNORMAL LOW (ref 36.0–46.0)
Hemoglobin: 10 g/dL — ABNORMAL LOW (ref 12.0–15.0)
MCH: 27.9 pg (ref 26.0–34.0)
MCHC: 31.3 g/dL (ref 30.0–36.0)
MCV: 89.1 fL (ref 80.0–100.0)
Platelets: 362 10*3/uL (ref 150–400)
RBC: 3.58 MIL/uL — ABNORMAL LOW (ref 3.87–5.11)
RDW: 17.7 % — ABNORMAL HIGH (ref 11.5–15.5)
WBC: 8.6 10*3/uL (ref 4.0–10.5)
nRBC: 0.7 % — ABNORMAL HIGH (ref 0.0–0.2)

## 2020-03-27 LAB — HEPATITIS B CORE ANTIBODY, TOTAL: Hep B Core Total Ab: NONREACTIVE

## 2020-03-27 LAB — HEPATITIS B SURFACE ANTIGEN: Hepatitis B Surface Ag: NONREACTIVE

## 2020-03-27 NOTE — Progress Notes (Signed)
  [facility]  ROUNDING NOTE   Subjective:  Patient seen and evaluated during dialysis treatment today. Ultrafiltration target 2.5 kg.  Objective:  Vital signs in last 24 hours:  Temperature 97.1 pulse 88 respirations 18 blood pressure 131/61   Physical Exam: General:  No acute distress  Head:  Normocephalic, atraumatic. Moist oral mucosal membranes  Eyes:  Anicteric  Neck:  Supple  Lungs:   Clear to auscultation, normal effort  Heart:  S1S2 no rubs  Abdomen:   Soft, nontender, bowel sounds present  Extremities:  1+ peripheral edema.  Neurologic:  Awake, alert, following commands  Skin:  No lesions  Access:  Right IJ PermCath    Basic Metabolic Panel: Recent Labs  Lab 03/23/20 0636 03/25/20 0508 03/27/20 0441  NA 139 137 135  K 4.6 4.4 4.1  CL 99 100 96*  CO2 _0 GLUCOSE 87 91 94  BUN 41* 40* 38*  CREATININE 4.81* 4.78* 4.78*  CALCIUM 9.4 9.3 9.7  PHOS 5.5* 5.7* 4.5    Liver Function Tests: Recent Labs  Lab 03/23/20 0636 03/25/20 0508 03/27/20 0441  ALBUMIN 3.2* 3.3* 3.3*   No results for input(s): LIPASE, AMYLASE in the last 168 hours. Recent Labs  Lab 03/23/20 0636  AMMONIA 22    CBC: Recent Labs  Lab 03/23/20 0636 03/25/20 0508 03/27/20 0441  WBC 8.5 8.1 8.6  HGB 9.9* 9.8* 10.0*  HCT 31.0* 31.8* 31.9*  MCV 89.1 90.6 89.1  PLT 349 352 362    Cardiac Enzymes: No results for input(s): CKTOTAL, CKMB, CKMBINDEX, TROPONINI in the last 168 hours.  BNP: Invalid input(s): POCBNP  CBG: No results for input(s): GLUCAP in the last 168 hours.  Microbiology: No results found for this or any previous visit.  Coagulation Studies: No results for input(s): LABPROT, INR in the last 72 hours.  Urinalysis: No results for input(s): COLORURINE, LABSPEC, PHURINE, GLUCOSEU, HGBUR, BILIRUBINUR, KETONESUR, PROTEINUR, UROBILINOGEN, NITRITE, LEUKOCYTESUR in the last 72 hours.  Invalid input(s): APPERANCEUR    Imaging: No results  found.   Medications:       Assessment/ Plan:  69 y.o. 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  1.  Acute kidney injury/chronic kidney disease stage IIIb baseline EGFR 41.    Patient seen during dialysis treatment today.  Tolerating well.  Ultrafiltration target 2.5 kg.  2.  Generalized edema/volume overload.  We are planning for net ultrafiltration of 2.5 kg today.  3.  Anemia of chronic kidney disease.  Continue Retacrit 6000 units IV with dialysis treatments.  Hemoglobin at target at 10.0.  4.  Hypotension.  Albumin administered today during dialysis for blood pressure support.   LOS: 0 Jillian Paul 12/17/20218:01 AM

## 2020-03-30 LAB — RENAL FUNCTION PANEL
Albumin: 3.3 g/dL — ABNORMAL LOW (ref 3.5–5.0)
Anion gap: 16 — ABNORMAL HIGH (ref 5–15)
BUN: 72 mg/dL — ABNORMAL HIGH (ref 8–23)
CO2: 24 mmol/L (ref 22–32)
Calcium: 9.7 mg/dL (ref 8.9–10.3)
Chloride: 93 mmol/L — ABNORMAL LOW (ref 98–111)
Creatinine, Ser: 5.89 mg/dL — ABNORMAL HIGH (ref 0.44–1.00)
GFR, Estimated: 7 mL/min — ABNORMAL LOW (ref >60)
Glucose, Bld: 94 mg/dL (ref 70–99)
Phosphorus: 5 mg/dL — ABNORMAL HIGH (ref 2.5–4.6)
Potassium: 3.9 mmol/L (ref 3.5–5.1)
Sodium: 133 mmol/L — ABNORMAL LOW (ref 135–145)

## 2020-03-30 LAB — CBC
HCT: 29.9 % — ABNORMAL LOW (ref 36.0–46.0)
Hemoglobin: 9.5 g/dL — ABNORMAL LOW (ref 12.0–15.0)
MCH: 27.7 pg (ref 26.0–34.0)
MCHC: 31.8 g/dL (ref 30.0–36.0)
MCV: 87.2 fL (ref 80.0–100.0)
Platelets: 327 10*3/uL (ref 150–400)
RBC: 3.43 MIL/uL — ABNORMAL LOW (ref 3.87–5.11)
RDW: 17.5 % — ABNORMAL HIGH (ref 11.5–15.5)
WBC: 7.4 10*3/uL (ref 4.0–10.5)
nRBC: 2.6 % — ABNORMAL HIGH (ref 0.0–0.2)

## 2020-03-30 NOTE — Progress Notes (Signed)
  [facility]  ROUNDING NOTE   Subjective:  Patient seen and evaluated during hemodialysis treatment. Sitting up in chair. UF target 2 kg today.  Objective:  Vital signs in last 24 hours:  Temperature 97.7 pulse 90 respirations 33 blood pressure 116/53   Physical Exam: General:  No acute distress  Head:  Normocephalic, atraumatic. Moist oral mucosal membranes  Eyes:  Anicteric  Neck:  Supple  Lungs:   Clear to auscultation, normal effort  Heart:  S1S2 no rubs  Abdomen:   Soft, nontender, bowel sounds present  Extremities:  1+ peripheral edema.  Neurologic:  Awake, alert, following commands  Skin:  No lesions  Access:  Right IJ PermCath    Basic Metabolic Panel: Recent Labs  Lab 03/25/20 0508 03/27/20 0441 03/30/20 0537  NA 137 135 133*  K 4.4 4.1 3.9  CL 100 96* 93*  CO2 _0 GLUCOSE 91 94 94  BUN 40* 38* 72*  CREATININE 4.78* 4.78* 5.89*  CALCIUM 9.3 9.7 9.7  PHOS 5.7* 4.5 5.0*    Liver Function Tests: Recent Labs  Lab 03/25/20 0508 03/27/20 0441 03/30/20 0537  ALBUMIN 3.3* 3.3* 3.3*   No results for input(s): LIPASE, AMYLASE in the last 168 hours. No results for input(s): AMMONIA in the last 168 hours.  CBC: Recent Labs  Lab 03/25/20 0508 03/27/20 0441 03/30/20 0537  WBC 8.1 8.6 7.4  HGB 9.8* 10.0* 9.5*  HCT 31.8* 31.9* 29.9*  MCV 90.6 89.1 87.2  PLT 352 362 327    Cardiac Enzymes: No results for input(s): CKTOTAL, CKMB, CKMBINDEX, TROPONINI in the last 168 hours.  BNP: Invalid input(s): POCBNP  CBG: No results for input(s): GLUCAP in the last 168 hours.  Microbiology: No results found for this or any previous visit.  Coagulation Studies: No results for input(s): LABPROT, INR in the last 72 hours.  Urinalysis: No results for input(s): COLORURINE, LABSPEC, PHURINE, GLUCOSEU, HGBUR, BILIRUBINUR, KETONESUR, PROTEINUR, UROBILINOGEN, NITRITE, LEUKOCYTESUR in the last 72 hours.  Invalid input(s): APPERANCEUR     Imaging: No results found.   Medications:       Assessment/ Plan:  69 y.o. 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  1.  Acute kidney injury/chronic kidney disease stage IIIb baseline EGFR 41.    Patient sitting up in chair during hemodialysis this AM.  Ultrafiltration target 2 kg.  2.  Generalized edema/volume overload.  Overall improved with the initiation of dialysis.  Continue to monitor edema status.  3.  Anemia of chronic kidney disease.  Hemoglobin down slightly to 9.5.  Maintain the patient on Retacrit 6000 IV with dialysis.  4.  Hypotension.  Continue albumin support as needed for maintain his blood pressure during dialysis treatments.   LOS: 0 Leroy Pettway 12/20/20218:10 AM

## 2020-03-31 ENCOUNTER — Other Ambulatory Visit (HOSPITAL_COMMUNITY): Payer: Medicare Other

## 2020-03-31 HISTORY — PX: IR PARACENTESIS: IMG2679

## 2020-03-31 LAB — NOVEL CORONAVIRUS, NAA (HOSP ORDER, SEND-OUT TO REF LAB; TAT 18-24 HRS): SARS-CoV-2, NAA: NOT DETECTED

## 2020-03-31 MED ORDER — LIDOCAINE HCL 1 % IJ SOLN
INTRAMUSCULAR | Status: DC | PRN
  Administered 2020-03-31: 5 mL

## 2020-03-31 MED ORDER — LIDOCAINE HCL 1 % IJ SOLN
INTRAMUSCULAR | Status: AC
  Filled 2020-03-31: qty 20

## 2020-03-31 NOTE — Procedures (Signed)
PROCEDURE SUMMARY:  Successful US guided paracentesis from right lateral abdomen.  Yielded 1.6 liters of clear yellow fluid.  No immediate complications.  Patient tolerated well.  EBL = trace   Shay Jhaveri S Daril Warga PA-C 03/31/2020 2:28 PM

## 2020-07-10 DEATH — deceased

## 2021-07-14 IMAGING — DX DG CHEST 1V PORT
1 series · 1 of 1 positions shown · non-contrast
Comparison: 03/15/2020

CLINICAL DATA: Pleural effusion

EXAM:
PORTABLE CHEST 1 VIEW

[chest ap]
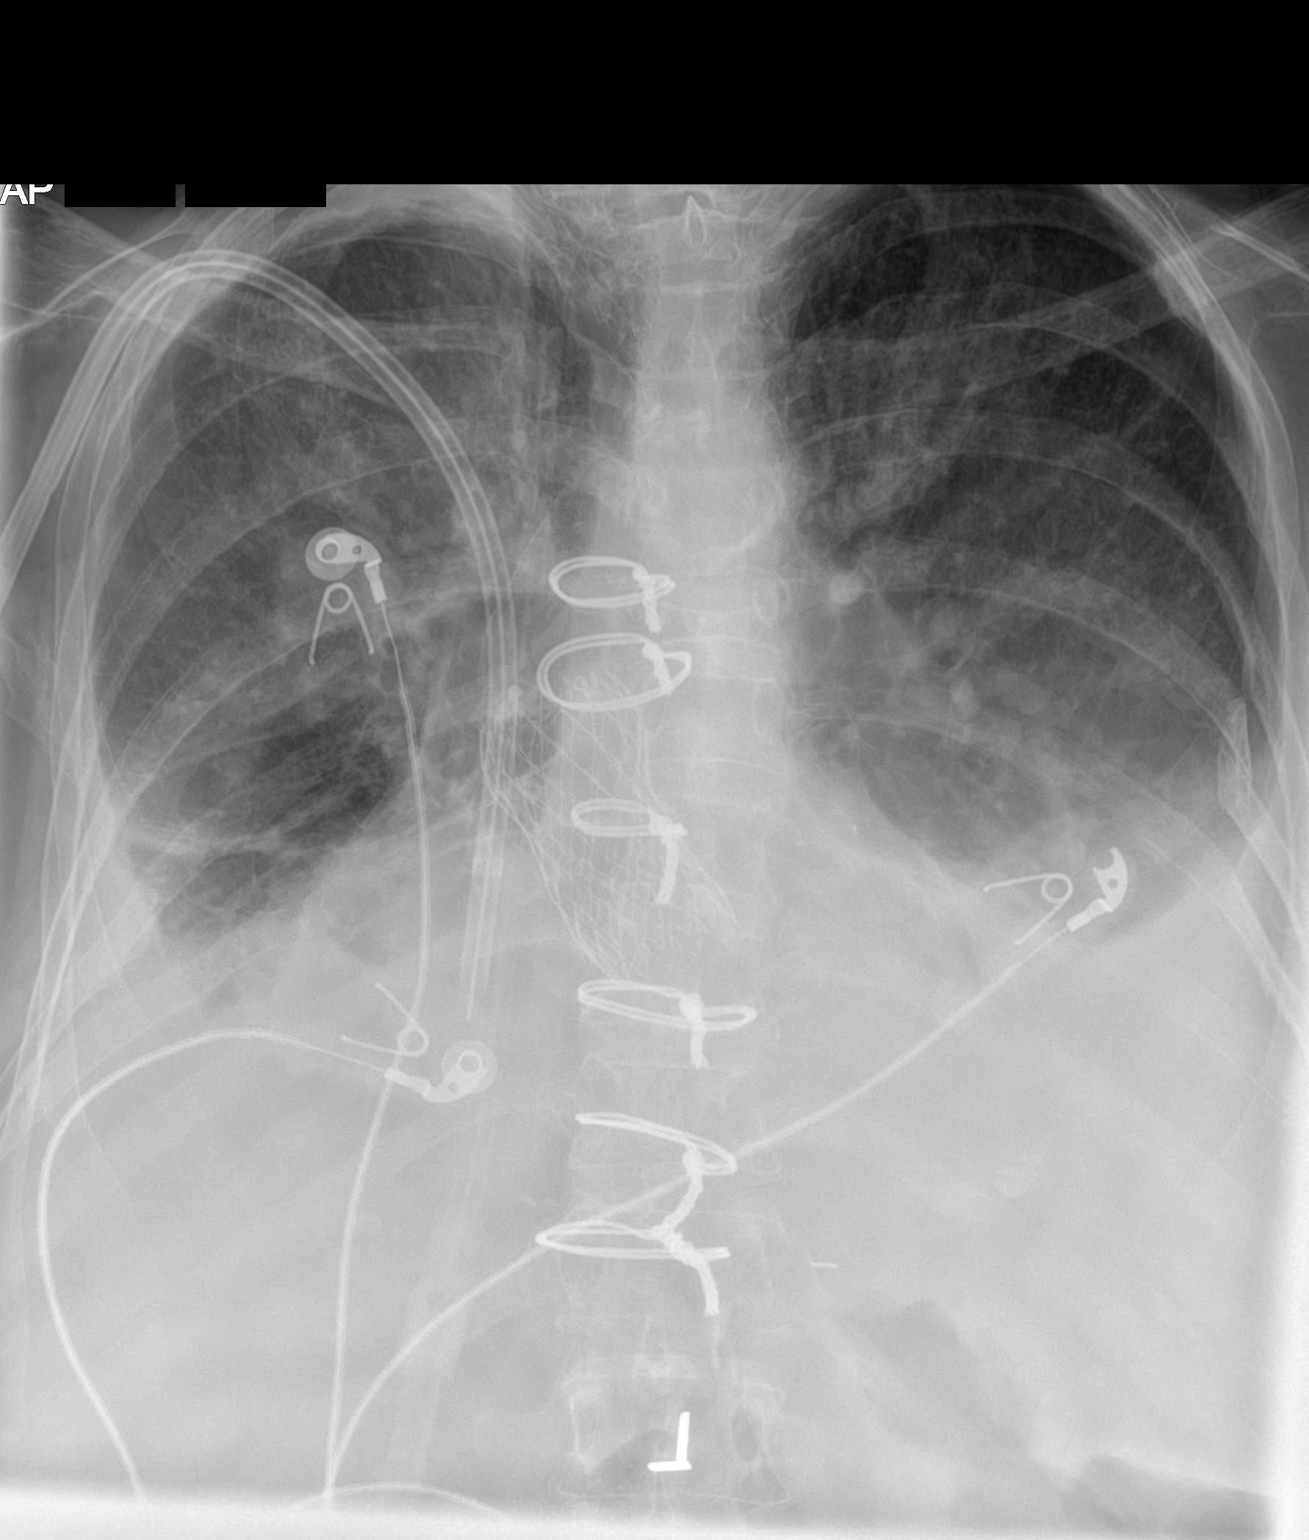

[1 of 1 positions shown; findings below may reference images not displayed]

FINDINGS: Right multi lumen central line is unchanged. Persistent bilateral
pleural effusions with adjacent atelectasis. Slightly improved
aeration at the right lung base. Similar persistent patchy density
bilaterally. No pneumothorax. Stable partially obscured
cardiomediastinal contours. Post TAVR.
IMPRESSION: Persistent bilateral pleural effusions with adjacent atelectasis.
Slightly improved aeration at the right lung base. Similar bilateral
patchy opacities.
